# Patient Record
Sex: Male | Born: 1995 | Race: Black or African American | Hispanic: No | Marital: Single | Smoking: Never smoker
Health system: Southern US, Community
[De-identification: ages and names within clinical notes are randomized; demographics above are authoritative.]

## PROBLEM LIST (undated history)

## (undated) DIAGNOSIS — R569 Unspecified convulsions: Secondary | ICD-10-CM

## (undated) HISTORY — PX: WISDOM TOOTH EXTRACTION: SHX21

---
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
## 2017-01-31 ENCOUNTER — Telehealth: Payer: Self-pay | Admitting: Neurology

## 2017-01-31 ENCOUNTER — Ambulatory Visit: Payer: Self-pay | Admitting: Neurology

## 2017-01-31 ENCOUNTER — Encounter: Payer: Self-pay | Admitting: Neurology

## 2017-01-31 NOTE — Telephone Encounter (Signed)
The patient did not show for a new patient appointment today, this is the first no-show.

## 2017-03-12 ENCOUNTER — Other Ambulatory Visit: Payer: Self-pay

## 2017-03-12 ENCOUNTER — Emergency Department (HOSPITAL_COMMUNITY)
Admission: EM | Admit: 2017-03-12 | Discharge: 2017-03-13 | Disposition: A | Discharge status: Home / Self Care | Payer: Medicaid Other | Attending: Emergency Medicine | Admitting: Emergency Medicine

## 2017-03-12 ENCOUNTER — Emergency Department (HOSPITAL_COMMUNITY): Payer: Medicaid Other

## 2017-03-12 ENCOUNTER — Encounter (HOSPITAL_COMMUNITY): Payer: Self-pay | Admitting: Emergency Medicine

## 2017-03-12 DIAGNOSIS — W01198A Fall on same level from slipping, tripping and stumbling with subsequent striking against other object, initial encounter: Secondary | ICD-10-CM | POA: Insufficient documentation

## 2017-03-12 DIAGNOSIS — Y929 Unspecified place or not applicable: Secondary | ICD-10-CM | POA: Insufficient documentation

## 2017-03-12 DIAGNOSIS — Z79899 Other long term (current) drug therapy: Secondary | ICD-10-CM | POA: Insufficient documentation

## 2017-03-12 DIAGNOSIS — Y998 Other external cause status: Secondary | ICD-10-CM | POA: Diagnosis not present

## 2017-03-12 DIAGNOSIS — S0181XA Laceration without foreign body of other part of head, initial encounter: Secondary | ICD-10-CM | POA: Diagnosis not present

## 2017-03-12 DIAGNOSIS — S0990XA Unspecified injury of head, initial encounter: Secondary | ICD-10-CM | POA: Insufficient documentation

## 2017-03-12 DIAGNOSIS — F1210 Cannabis abuse, uncomplicated: Secondary | ICD-10-CM | POA: Diagnosis not present

## 2017-03-12 DIAGNOSIS — R569 Unspecified convulsions: Secondary | ICD-10-CM | POA: Diagnosis present

## 2017-03-12 DIAGNOSIS — Y9389 Activity, other specified: Secondary | ICD-10-CM | POA: Insufficient documentation

## 2017-03-12 DIAGNOSIS — R55 Syncope and collapse: Secondary | ICD-10-CM | POA: Insufficient documentation

## 2017-03-12 DIAGNOSIS — R5383 Other fatigue: Secondary | ICD-10-CM | POA: Diagnosis not present

## 2017-03-12 MED ORDER — LAMOTRIGINE 100 MG PO TABS
100.0000 mg | ORAL_TABLET | Freq: Once | ORAL | Status: AC
  Administered 2017-03-12: 100 mg via ORAL
  Filled 2017-03-12 (×2): qty 1

## 2017-03-12 MED ORDER — TETANUS-DIPHTH-ACELL PERTUSSIS 5-2.5-18.5 LF-MCG/0.5 IM SUSP
0.5000 mL | Freq: Once | INTRAMUSCULAR | Status: DC
  Filled 2017-03-12: qty 0.5

## 2017-03-12 MED ORDER — LIDOCAINE-EPINEPHRINE (PF) 2 %-1:200000 IJ SOLN
10.0000 mL | Freq: Once | INTRAMUSCULAR | Status: AC
  Administered 2017-03-12: 10 mL
  Filled 2017-03-12: qty 20

## 2017-03-12 NOTE — ED Notes (Signed)
Seizure pads placed on stretcher

## 2017-03-12 NOTE — ED Provider Notes (Signed)
[facility] Provider Note   CSN: [id number] Arrival date & time: 03/12/17  2123     History   Chief Complaint Chief Complaint  Patient presents with  . Seizures  . Facial Laceration    HPI Brad Maldonado is a 22 y.o. male.  The history is provided by the patient.  Seizures   This is a recurrent problem. The current episode started 1 to 2 hours ago. The problem has been resolved. There was 1 seizure. The most recent episode lasted less than 30 seconds. Associated symptoms include sleepiness. Pertinent negatives include no confusion, no headaches, no visual disturbance, no cough, no vomiting and no diarrhea. Characteristics include rhythmic jerking and loss of consciousness. The episode was witnessed. There was no sensation of an aura present. The seizures did not continue in the ED. The seizure(s) had no focality. Possible causes include missed seizure meds. Possible causes do not include medication or dosage change, sleep deprivation, recent illness or change in alcohol use. There has been no fever. Associated symptoms comments: Pain and swelling with laceration over the left eyebrow.  No visual changes.  No trouble walking or pain in the arms or legs. There were no medications administered prior to arrival.    Past Medical History:  Diagnosis Date  . Seizures (HCC)     There are no active problems to display for this patient.   Past Surgical History:  Procedure Laterality Date  . WISDOM TOOTH EXTRACTION         Home Medications    Prior to Admission medications   Medication Sig Start Date End Date Taking? Authorizing Provider  lamoTRIgine (LAMICTAL) 100 MG tablet Take 150 mg by mouth in the morning. 08/08/15  Yes [provider]  PHENObarbital (LUMINAL) 100 MG tablet Take 150-250 mg by mouth See admin instructions. Take 150 mg every morning and Take 250 mg every evening   Yes [provider]  ibuprofen  (ADVIL,MOTRIN) 600 MG tablet Take 1 tablet (600 mg total) by mouth every 6 (six) hours as needed. Patient not taking: Reported on 03/12/2017 03/03/16   Gregary CromerMitchell, Jessica B, PA-C    Family History No family history on file.  Social History Social History   Tobacco Use  . Smoking status: Never Smoker  . Smokeless tobacco: Never Used  Substance Use Topics  . Alcohol use: Yes  . Drug use: Yes    Types: Marijuana     Allergies   Patient has no known allergies.   Review of Systems Review of Systems  Eyes: Negative for visual disturbance.  Respiratory: Negative for cough.   Gastrointestinal: Negative for diarrhea and vomiting.  Neurological: Positive for seizures and loss of consciousness. Negative for headaches.  Psychiatric/Behavioral: Negative for confusion.  All other systems reviewed and are negative.    Physical Exam Updated Vital Signs BP 112/70   Pulse 68   Temp 99.1 F (37.3 C) (Oral)   Resp (!) 0   Ht 5\' 6"  (1.676 m)   Wt 74.8 kg (165 lb)   SpO2 99%   BMI 26.63 kg/m   Physical Exam  Constitutional: He is oriented to person, place, and time. He appears well-developed and well-nourished. No distress.  HENT:  Head: Normocephalic. Head is with abrasion, with contusion and with laceration.    Mouth/Throat: Oropharynx is clear and moist.  Eyes: Conjunctivae and EOM are normal. Pupils are equal, round, and reactive to light.  Neck: Normal range of motion. Neck supple. No  spinous process tenderness and no muscular tenderness present. Normal range of motion present.  Cardiovascular: Normal rate, regular rhythm and intact distal pulses.  No murmur heard. Pulmonary/Chest: Effort normal and breath sounds normal. No respiratory distress. He has no wheezes. He has no rales.  Abdominal: Soft. He exhibits no distension. There is no tenderness. There is no rebound and no guarding.  Musculoskeletal: Normal range of motion. He exhibits no edema or tenderness.  Small  superficial abrasions over the right mcp joints  Neurological: He is alert and oriented to person, place, and time.  Skin: Skin is warm and dry. No rash noted. No erythema.  Psychiatric: He has a normal mood and affect. His behavior is normal.  Nursing note and vitals reviewed.    ED Treatments / Results  Labs (all labs ordered are listed, but only abnormal results are displayed) Labs Reviewed - No data to display  EKG  EKG Interpretation  Date/Time:  Monday March 12 2017 21:30:44 EST Ventricular Rate:  96 PR Interval:    QRS Duration: 93 QT Interval:  335 QTC Calculation: 424 R Axis:   71 Text Interpretation:  Sinus rhythm Left atrial enlargement ST elev, probable normal early repol pattern No previous tracing Confirmed by Gwyneth Sprout ([id number]) on 03/12/2017 11:10:52 PM       Radiology Ct Head Wo Contrast  Result Date: 03/12/2017 CLINICAL DATA:  22 y/o M; witnessed seizure, patient fell falling forward hitting head on concrete, large laceration below left eyebrow. History of seizures. EXAM: CT HEAD WITHOUT CONTRAST TECHNIQUE: Contiguous axial images were obtained from the base of the skull through the vertex without intravenous contrast. COMPARISON:  None. FINDINGS: Brain: No evidence of acute infarction, hemorrhage, hydrocephalus, extra-axial collection or mass lesion/mass effect. Vascular: No hyperdense vessel or unexpected calcification. Skull: Normal. Negative for fracture or focal lesion. Left frontal/periorbital scalp region contusion and laceration. Sinuses/Orbits: No acute finding. Other: None. IMPRESSION: Left frontal/periorbital scalp region contusion and laceration. No intraorbital trauma or acute fracture identified. No acute intracranial abnormality. Electronically Signed   By: Mitzi Hansen M.D.   On: 03/12/2017 22:48    Procedures Procedures (including critical care time)  LACERATION REPAIR Performed by: [facility] Authorized by: Gwyneth Sprout Consent: Verbal consent obtained. Risks and benefits: risks, benefits and alternatives were discussed Consent given by: patient Patient identity confirmed: provided demographic data Prepped and Draped in normal sterile fashion Wound explored  Laceration Location: left eyebrow  Laceration Length: 3cm  No Foreign Bodies seen or palpated  Anesthesia: local infiltration  Local anesthetic: lidocaine 2% with epinephrine  Anesthetic total: 4 ml  Irrigation method: syringe Amount of cleaning: standard  Skin closure: 6.0 vicryl  Number of sutures: 6  Technique: simple interrupted  Patient tolerance: Patient tolerated the procedure well with no immediate complications.   Medications Ordered in ED Medications  Tdap (BOOSTRIX) injection 0.5 mL (not administered)  lamoTRIgine (LAMICTAL) tablet 100 mg (100 mg Oral Given 03/12/17 2223)  lidocaine-EPINEPHrine (XYLOCAINE W/EPI) 2 %-1:200000 (PF) injection 10 mL (10 mLs Infiltration Given 03/12/17 2224)     Initial Impression / Assessment and Plan / ED Course  I have reviewed the triage vital signs and the nursing notes.  Pertinent labs & imaging results that were available during my care of the patient were reviewed by me and considered in my medical decision making (see chart for details).     Patient with history of seizure disorder that has missed some of his Lamictal dose is presenting today after  having a seizure at the store.  Patient is currently neurologically intact.  Does have evidence of laceration of the left eyebrow which was repaired as above.  Tetanus shot is up-to-date.  DT was negative for acute findings and no neck pain at this time.  Patient was able to ambulate without difficulty.  Will discharge home.  Final Clinical Impressions(s) / ED Diagnoses   Final diagnoses:  Seizure [facility])  Facial laceration, initial encounter    ED Discharge Orders    None       Gwyneth Sprout, MD 03/12/17 2357

## 2017-03-12 NOTE — ED Triage Notes (Addendum)
Pt to ED via [facility] aff[facility] reported having a witnessed seizure.  Pt fell forward hitting his head on concrete.  Pt has a large lac below left eyebrow, abrasion to left cheek and abrasions to bil hands.  EMS reports approx 50ml blood loss.  On arrival to ED pt alert and oriented.  C-Collar in place   CBG per EMS 111

## 2017-06-28 ENCOUNTER — Other Ambulatory Visit: Payer: Self-pay

## 2017-06-28 ENCOUNTER — Encounter (HOSPITAL_COMMUNITY): Payer: Self-pay

## 2017-06-28 ENCOUNTER — Emergency Department (HOSPITAL_COMMUNITY)
Admission: EM | Admit: 2017-06-28 | Discharge: 2017-06-29 | Disposition: A | Discharge status: Home / Self Care | Payer: Self-pay | Attending: Emergency Medicine | Admitting: Emergency Medicine

## 2017-06-28 DIAGNOSIS — R569 Unspecified convulsions: Secondary | ICD-10-CM | POA: Insufficient documentation

## 2017-06-28 DIAGNOSIS — Z5321 Procedure and treatment not carried out due to patient leaving prior to being seen by health care provider: Secondary | ICD-10-CM | POA: Insufficient documentation

## 2017-06-28 NOTE — ED Notes (Signed)
Bed: [id number] Expected date: 06/28/17 Expected time:  Means of arrival:  Comments:

## 2017-06-28 NOTE — ED Triage Notes (Signed)
Per EMS pt noted having seizure sitting in car that lasted approx 2 min according to witnesses. Hx of epilepsy since birth.

## 2019-09-01 IMAGING — CT CT HEAD W/O CM
4 series · 16 of 47 positions shown, 18 images · non-contrast
Comparison: None.

CLINICAL DATA: 21 y/o M; witnessed seizure, patient fell falling
forward hitting head on concrete, large laceration below left
eyebrow. History of seizures.

EXAM:
CT HEAD WITHOUT CONTRAST
TECHNIQUE: Contiguous axial images were obtained from the base of the skull
through the vertex without intravenous contrast.

[Series 3: head wo · axial · 0.44mm/px · z∈[+1628,+1748]mm · 7 of 34 slices shown, 9 images]
[im 5/34  brain]
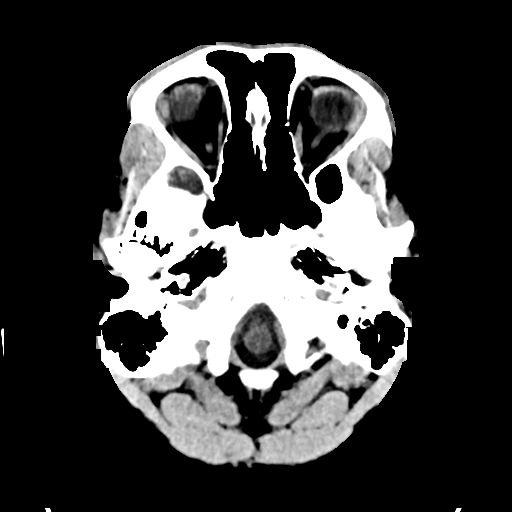
[im 5/34  bone]
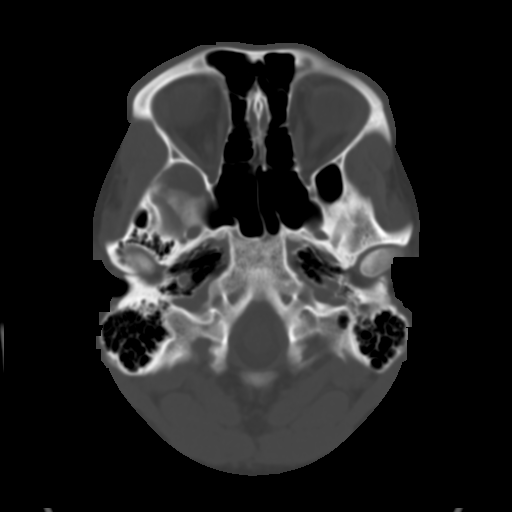
[im 9/34  brain]
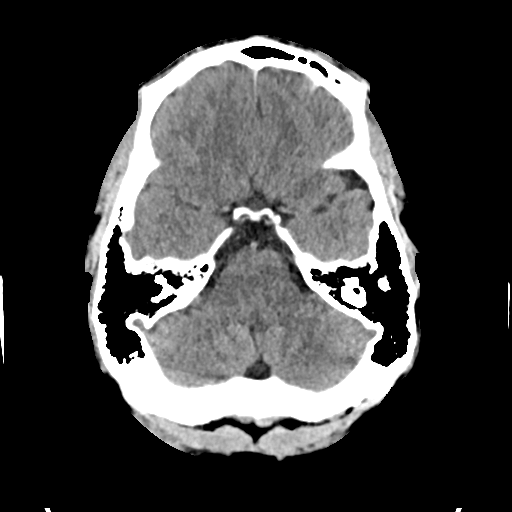
[im 13/34  brain]
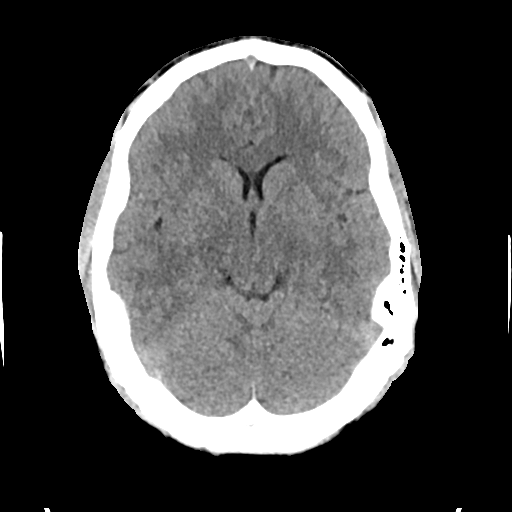
[im 17/34  brain]
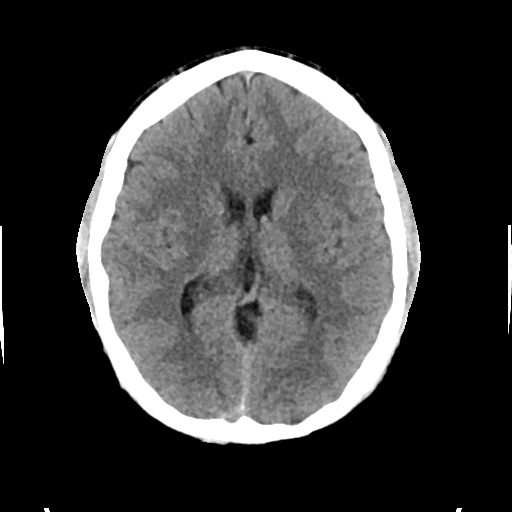
[im 21/34  brain]
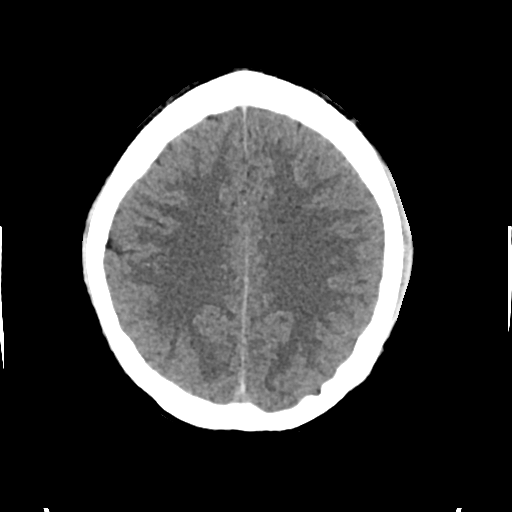
[im 21/34  bone]
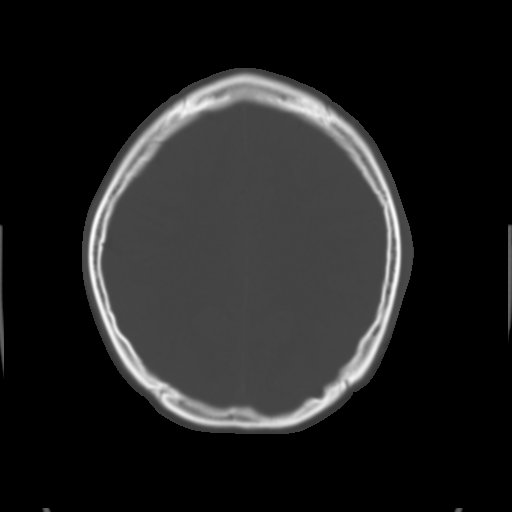
[im 25/34  brain]
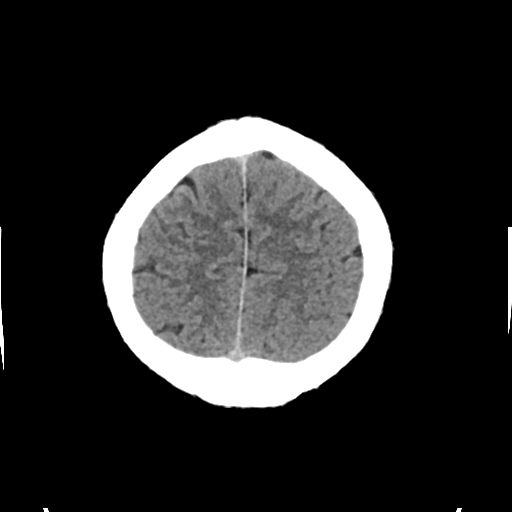
[im 29/34  brain]
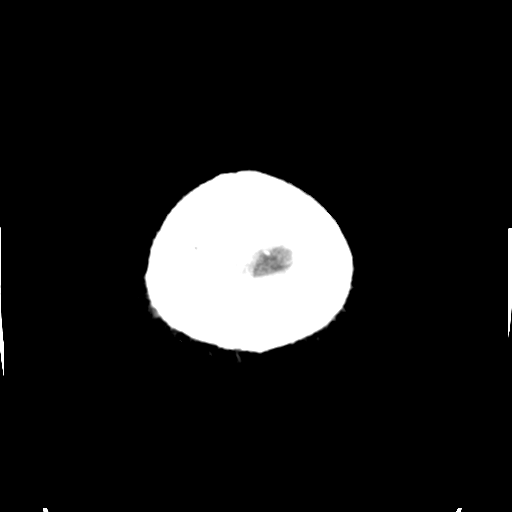

[Series 4: head bone · axial · 0.44mm/px · z∈[+1624,+1658]mm · 3 of 85 slices shown]
[im 9/85  bone]
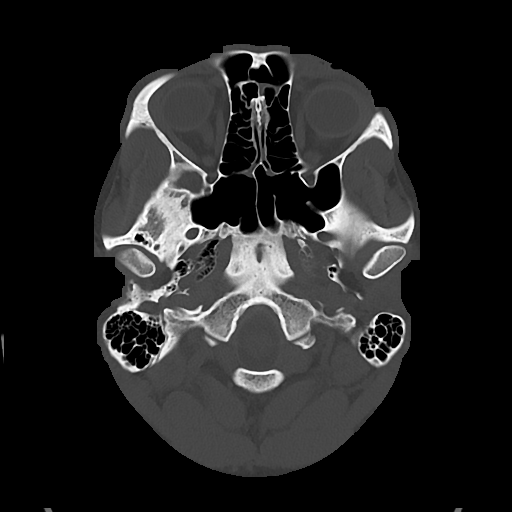
[im 17/85  bone]
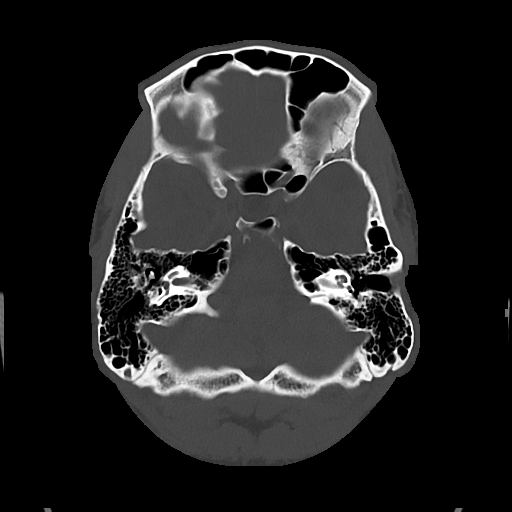
[im 26/85  bone]
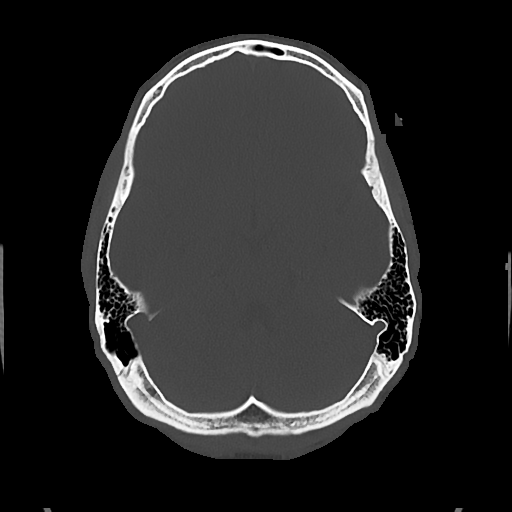

[Series 5: cor soft · coronal · 0.34mm/px · 3 of 71 slices shown]
[im 26/71  brain]
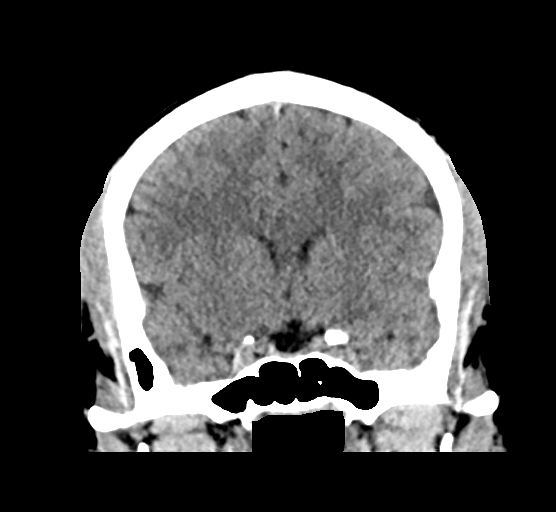
[im 32/71  brain]
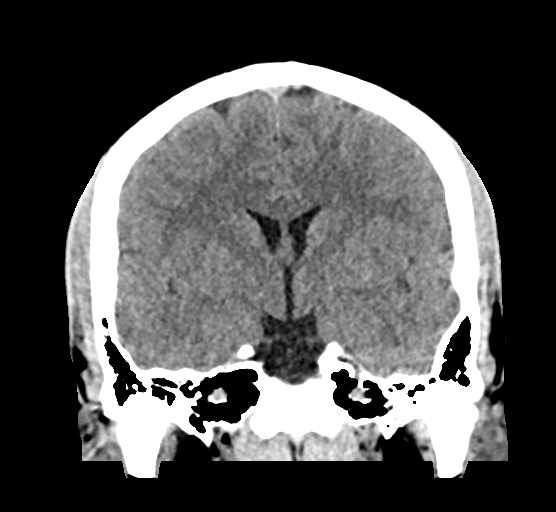
[im 39/71  brain]
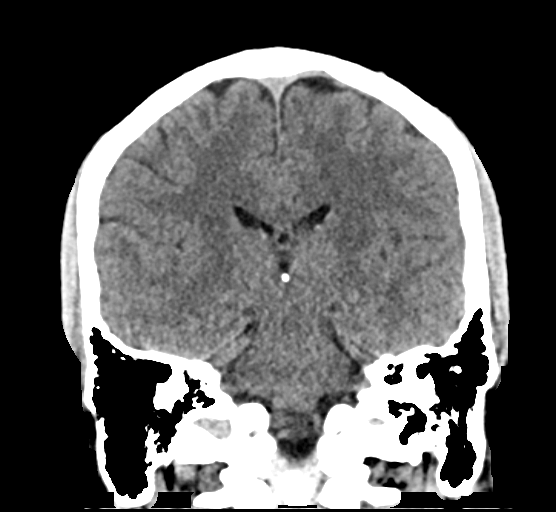

[Series 6: sag soft · sagittal · 0.35mm/px · 3 of 67 slices shown]
[im 23/67  brain]
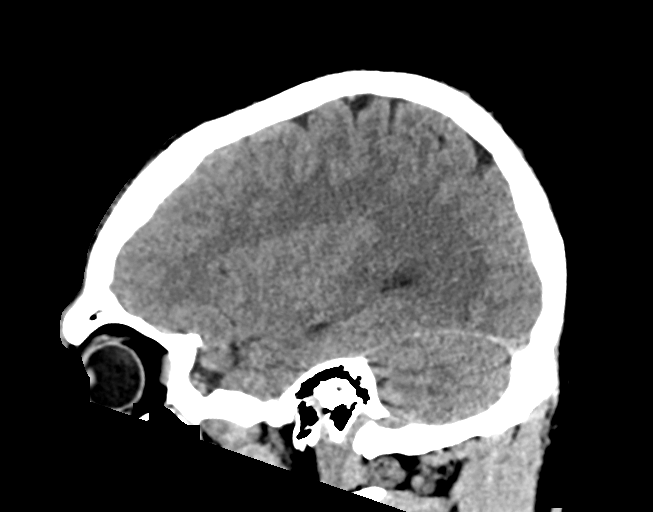
[im 34/67  brain]
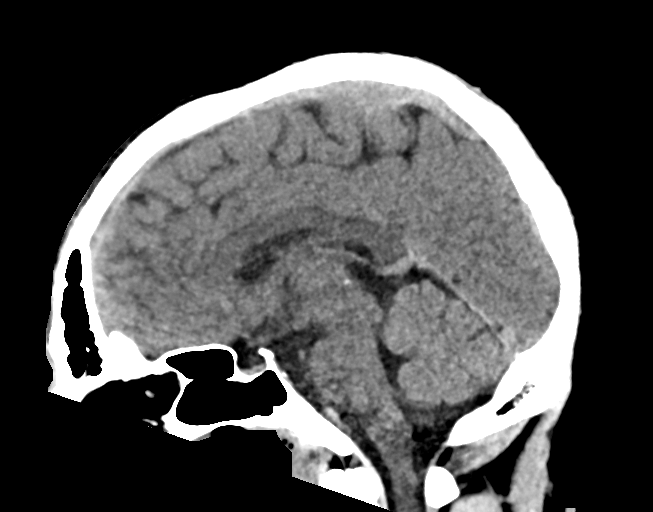
[im 45/67  brain]
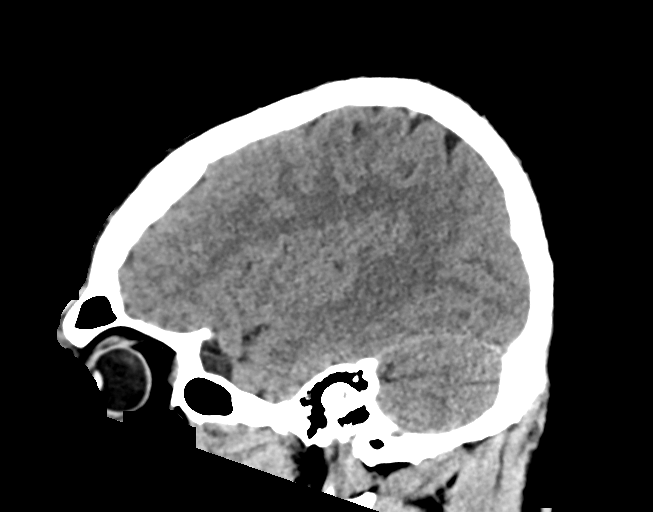

[16 of 47 positions shown; findings below may reference images not displayed]

FINDINGS: Brain: No evidence of acute infarction, hemorrhage, hydrocephalus,
extra-axial collection or mass lesion/mass effect.

Vascular: No hyperdense vessel or unexpected calcification.

Skull: Normal. Negative for fracture or focal lesion. Left
frontal/periorbital scalp region contusion and laceration.

Sinuses/Orbits: No acute finding.

Other: None.
IMPRESSION: Left frontal/periorbital scalp region contusion and laceration. No
intraorbital trauma or acute fracture identified. No acute
intracranial abnormality.

By: Farzeena Nabu M.D.
